# Patient Record
Sex: Male | Born: 1975
Health system: Southern US, Community
[De-identification: ages and names within clinical notes are randomized; demographics above are authoritative.]

## PROBLEM LIST (undated history)

## (undated) DIAGNOSIS — K259 Gastric ulcer, unspecified as acute or chronic, without hemorrhage or perforation: Secondary | ICD-10-CM

## (undated) DIAGNOSIS — I10 Essential (primary) hypertension: Secondary | ICD-10-CM

## (undated) DIAGNOSIS — E7800 Pure hypercholesterolemia, unspecified: Secondary | ICD-10-CM

## (undated) HISTORY — DX: Essential (primary) hypertension: I10

## (undated) HISTORY — PX: TYMPANOSTOMY TUBE PLACEMENT: SHX32

## (undated) HISTORY — DX: Pure hypercholesterolemia, unspecified: E78.00

---

## 1999-10-09 ENCOUNTER — Emergency Department (HOSPITAL_COMMUNITY): Admission: EM | Admit: 1999-10-09 | Discharge: 1999-10-09 | Payer: Self-pay | Admitting: Emergency Medicine

## 1999-10-28 ENCOUNTER — Observation Stay (HOSPITAL_COMMUNITY): Admission: EM | Admit: 1999-10-28 | Discharge: 1999-10-28 | Payer: Self-pay | Admitting: Emergency Medicine

## 1999-10-28 ENCOUNTER — Encounter: Payer: Self-pay | Admitting: Emergency Medicine

## 1999-10-28 HISTORY — PX: OTHER SURGICAL HISTORY: SHX169

## 2007-02-23 ENCOUNTER — Emergency Department (HOSPITAL_COMMUNITY): Admission: EM | Admit: 2007-02-23 | Discharge: 2007-02-23 | Payer: Self-pay | Admitting: Emergency Medicine

## 2007-03-09 ENCOUNTER — Emergency Department (HOSPITAL_COMMUNITY): Admission: EM | Admit: 2007-03-09 | Discharge: 2007-03-09 | Payer: Self-pay | Admitting: Emergency Medicine

## 2007-08-12 ENCOUNTER — Emergency Department (HOSPITAL_COMMUNITY): Admission: EM | Admit: 2007-08-12 | Discharge: 2007-08-12 | Payer: Self-pay | Admitting: Emergency Medicine

## 2008-07-16 ENCOUNTER — Emergency Department (HOSPITAL_COMMUNITY): Admission: EM | Admit: 2008-07-16 | Discharge: 2008-07-16 | Payer: Self-pay | Admitting: Family Medicine

## 2008-10-21 ENCOUNTER — Emergency Department (HOSPITAL_COMMUNITY): Admission: EM | Admit: 2008-10-21 | Discharge: 2008-10-21 | Payer: Self-pay | Admitting: Emergency Medicine

## 2010-05-23 ENCOUNTER — Ambulatory Visit: Payer: Self-pay | Admitting: Internal Medicine

## 2010-05-23 LAB — CONVERTED CEMR LAB
ALT: 12 units/L (ref 0–53)
CO2: 27 meq/L (ref 19–32)
Calcium: 9.8 mg/dL (ref 8.4–10.5)
Chloride: 100 meq/L (ref 96–112)
Cholesterol: 161 mg/dL (ref 0–200)
Creatinine, Ser: 1.06 mg/dL (ref 0.40–1.50)
Glucose, Bld: 97 mg/dL (ref 70–99)
Sodium: 138 meq/L (ref 135–145)
Total Protein: 7.8 g/dL (ref 6.0–8.3)
Triglycerides: 59 mg/dL (ref <150)

## 2010-10-03 ENCOUNTER — Emergency Department (HOSPITAL_COMMUNITY)
Admission: EM | Admit: 2010-10-03 | Discharge: 2010-10-03 | Payer: Self-pay | Source: Home / Self Care | Admitting: Emergency Medicine

## 2011-01-26 NOTE — Op Note (Signed)
[place]  Patient:    Andre Jones, Andre Jones                        MRN: [id number] Proc. Date: 10/28/99 Adm. Date:  [id number] Disc. Date: [id number] Attending:  Rebeca Alert                           Operative Report  PREOPERATIVE DIAGNOSIS:  Fracture of right parasymphysis of mandible and fracture of left angle of mandible.  POSTOPERATIVE DIAGNOSIS:  Fracture of right parasymphysis of mandible and fracture of left angle of mandible.  OPERATION PERFORMED: 1. Surgical closed reduction of fractured right parasymphysis of mandible. 2. Closed reduction of left angle of mandible with application of Erich arch bars    and intermaxillaxy fixation.  SURGEON:  Hinton Dyer, D.D.S.  ESTIMATED BLOOD LOSS:  Less than 50 cc.  CONDITION AT END OF SURGERY:  Good  ANESTHESIA:  General.  DESCRIPTION OF PROCEDURE:  Following preoperative medication, the patient was brought to the operating room in the supine position in which he remained throughout the whole procedure.  He was intubated by a right nasal endotracheal  tube and prepped and draped in the usual fashion for an intraoral procedure. The mouth was prepared with Betadine scrub and paint both intraorally and extraorally placing a moist open 4 x 4 gauze around the endotracheal tube.  With a bite block in place, the mouth was suctioned out and an Erich arch bar was fitted by taking a 24 gauge stainless steel wire on two curved hemostats extending it from tooth #3 to tooth #15. Tooth #14 was fractured off and not visible.  A 24 gauge stainless steel wire was passed around teeth numbers 3, 4, 5, 6, 7, 8, 9, 10, 11, 12, 13, and 15 and around the [place] arch bar.  Each wire was then rosetted down and turned in  clockwise direction, turned into the mucobuccal fold.  Attention was then turned to the mandible and the arch was measured from tooth #18 to tooth #30 and this was  done by a 24  gauge stainless steel wire on two hemostats.  Again the [facility] arch bar was cut to fit the arch and then the 24 gauge stainless steel wire was passed around teeth numbers 18, 19, 20, 21, 22, 23, 25, 26, 27, 28, 29 and 30.  A 24 was left off as there was concern that the fracture coming up between that tooth and then one next to it might pull that tooth out of the arch.  The fractures were hen lined up prior to tightening the wires and once the wires were tightened, the fracture lined up nicely.  The inferior borders were checked by palpation and once it was felt that the arch bar was stable and that the fractures were lined up properly, the throat pack was removed. A [place] sump was placed and the stomach as checked for any contents.  Then the mouth was placed into what appeared to be the appropriate occlusion and five intermaxillary fixation wires were passed around the [place] arch bars and then turned in a clockwise fashion, folding everything down to avoid interference with the lips.  He was extubated on the table and returned to the recovery room in good condition.  He is being discharged home with a prescription for Keflex elixir x 300 cc, 250 mg per 5 cc and Lortab  Elixir x 300 cc 1 or 2 q.4h. p.r.n. pain.  He will be followed by me in my private office and we will get a postoperative x-ray in the office as well.DD:  10/28/99 TD:  10/29/99 Job: [id number]

## 2011-06-18 LAB — URINALYSIS, ROUTINE W REFLEX MICROSCOPIC
Glucose, UA: NEGATIVE
Hgb urine dipstick: NEGATIVE
Specific Gravity, Urine: 1.026

## 2011-06-18 LAB — LIPASE, BLOOD: Lipase: 40

## 2011-07-18 ENCOUNTER — Ambulatory Visit (INDEPENDENT_AMBULATORY_CARE_PROVIDER_SITE_OTHER): Payer: Self-pay | Admitting: Physician Assistant

## 2011-07-18 DIAGNOSIS — R079 Chest pain, unspecified: Secondary | ICD-10-CM

## 2011-07-18 DIAGNOSIS — R9431 Abnormal electrocardiogram [ECG] [EKG]: Secondary | ICD-10-CM

## 2011-07-18 NOTE — Progress Notes (Signed)
Andre Jones is a 35 y.o. male cigarette smoker with a h/o HTN, HLP, prior cocaine use who is referred for ETT for chest pain and abnormal EKG.  He also notes several syncopal episodes.  No FHx of SCD.  No significant FHx of CAD.  Exam unremarkable.    Exercise Treadmill Test  Pre-Exercise Testing Evaluation Rhythm: normal sinus  Rate: 64   PR:  .13 QRS:  .09  QT:  .38 QTc: .40     Test  Exercise Tolerance Test Ordering MD: Dr. Georganna Skeans  Interpreting MD:  Tereso Newcomer PA-C  Unique Test No: 1  Treadmill:  1  Indication for ETT: Abnormal EKG  Contraindication to ETT: No   Stress Modality: exercise - treadmill  Cardiac Imaging Performed: non   Protocol: standard Bruce - maximal  Max BP: 164/106  Max MPHR (bpm):  185 85% MPR (bpm):  157  MPHR obtained (bpm): 164 % MPHR obtained:  91  Reached 85% MPHR (min:sec):  12:30 Total Exercise Time (min-sec):  12:50  Workload in METS:  14.9 Borg Scale: 15  Reason ETT Terminated:  patient's desire to stop    ST Segment Analysis At Rest: normal ST segments - no evidence of significant ST depression With Exercise: no evidence of significant ST depression  Other Information Arrhythmia:  Occasional PVCs; one ventricular couplet Angina during ETT:  absent (0) Quality of ETT:  diagnostic  ETT Interpretation:  normal - no evidence of ischemia by ST analysis  Comments: Good exercise tolerance. No chest pain. Normal BP response to exercise. No ST-T changes to suggest ischemia. Frequent PVCs noted prior to exercise and during.     Recommendations: Follow up with Dr. Andrey Campanile as directed. Question if he has symptomatic PVCs.   Consider proceeding with an Echocardiogram and Event Monitor in light of his h/o syncope.  Defer decision to his PCP. Consider beta blocker therapy or non-dihydropyridine calcium channel blocker.  Defer decision to his PCP. Tereso Newcomer, PA-C

## 2012-08-22 ENCOUNTER — Ambulatory Visit: Payer: Self-pay | Admitting: Family Medicine

## 2012-09-17 ENCOUNTER — Encounter: Payer: Self-pay | Admitting: Family Medicine

## 2012-09-17 ENCOUNTER — Ambulatory Visit (INDEPENDENT_AMBULATORY_CARE_PROVIDER_SITE_OTHER): Payer: Self-pay | Admitting: Family Medicine

## 2012-09-17 VITALS — BP 147/91 | HR 48 | Temp 98.2°F | Ht 77.0 in | Wt 184.0 lb

## 2012-09-17 DIAGNOSIS — F17200 Nicotine dependence, unspecified, uncomplicated: Secondary | ICD-10-CM

## 2012-09-17 DIAGNOSIS — E7800 Pure hypercholesterolemia, unspecified: Secondary | ICD-10-CM

## 2012-09-17 DIAGNOSIS — I10 Essential (primary) hypertension: Secondary | ICD-10-CM | POA: Insufficient documentation

## 2012-09-17 DIAGNOSIS — Z720 Tobacco use: Secondary | ICD-10-CM

## 2012-09-17 MED ORDER — PRAVASTATIN SODIUM 20 MG PO TABS: 20.0000 mg | ORAL_TABLET | Freq: Every day | ORAL | Status: DC

## 2012-09-17 MED ORDER — AMLODIPINE BESYLATE 5 MG PO TABS: 5.0000 mg | ORAL_TABLET | Freq: Every day | ORAL | Status: DC

## 2012-09-17 MED ORDER — HYDROCHLOROTHIAZIDE 25 MG PO TABS: 25.0000 mg | ORAL_TABLET | Freq: Every day | ORAL | Status: DC

## 2012-09-17 NOTE — Progress Notes (Addendum)
Subjective:  Patient presents today to establish care. Chief complaint-noted.   1. Hypertension- BP Readings from Last 3 Encounters:  09/17/12 147/91   Home BP monitoring-no Patient focuses on having a low salt diet.  Compliant with medications-yes without side effects but did run out of amlodipine x 1 month due to transfer from [facility].  Denies any CP, HA,  blurry vision, LE edema, transient weakness, orthopnea, PND. Does have some SOB-see below. Feels an occasional palpitations but states he has had a normal stress test on treadmill. Also with occasional SOB but at rest and resolves within seconds with deep breaths.   2. Hyperlipidemia-compliant with pravastatin. Unsure of last LDL. No muscle aches/fatigue.   3. Tobacco abuse-10 pack years. Tried quitting a year ago using 1-800-quit now. Recently lost his job on new years day. Not ready to quit due to stressor of not being able to find work.   [facility] records: BP typically well controlled with exception of elevation in March 2013.  Amlodipine was decreased to 5mg  due to hypotension 07/09/11-Patient with syncopal event and had happened 3x. No hx seizures. "out for few seconds with funny feelings in chest and ears"  Exercise stress test 07/09/11-reported normal. Results from EKG to be scanned in but not clear if this was from stress test. Does have elevated t-wave (no history hyperkalemia in records). No j-point elevation noted but st segment slopes upward quickly in V2 through v4.  History of Trichomonas s/p treatment.    The following were reviewed and entered/updated in epic: Past Medical History  Diagnosis Date  . HTN (hypertension)   . Hypercholesteremia    Past Surgical History  Procedure Date  . Surgical closed reduction of fractured right parasymphysis of mandible 10/28/99  . Tympanostomy tube placement     as a child. Feels ear fullness sometimes. Has had normal audiological exam.    Medications- reviewed and  updated Reviewed problem list.  Allergies-reviewed and updated History   Social History  . Marital Status: Single    Spouse Name: N/A    Number of Children: N/A  . Years of Education: N/A   Social History Main Topics  . Smoking status: Current Every Day Smoker -- 0.5 packs/day for 10 years  . Smokeless tobacco: Not on file  . Alcohol Use: Yes     Comment: occasional-4 40 oz per week.   . Drug Use: Yes     Comment: marijuna  . Sexually Active: Yes -- Male partner(s)     Comment: Partner with depo provera. Wants testing again.    Other Topics Concern  . Not on file   Social History Narrative   Outgoing. Active. Lived [place] all of his life. Father of 2 children 7, 15 girls. Child support for oldest and gets to see her. Youngest doesn't get to see but is in touch-adopted. Fraternal twin brother very close to him. About to be best man in his wedding. Gets a ride or uses bus. [employer] through 10th grade. Working on Neurosurgeon. Recently lost job [employer] 2014. Worked for a year. Struggling for work because of criminal background.     ROS--See HPI   Objective:  BP 147/91  Pulse 48  Temp 98.2 F (36.8 C) (Oral)  Ht 6\' 5"  (1.956 m)  Wt 184 lb (83.462 kg)  BMI 21.82 kg/m2  General Appearance:    Pleasant, Alert, cooperative, no distress, appears stated age  Head:    Normocephalic, without obvious abnormality, atraumatic  Eyes:  PERRL, conjunctiva/corneas clear, EOM's intact       Ears:    Normal TM's. External ear canal on left with some excoriated areas (encouraged no q-tips)   Nose:  mucosa normal, slight drainage, some erythema  Throat:   Lips, mucosa, and tongue normal; poor dentition noted (patient cannot afford dentist currently)   Back:     Symmetric,  ROM normal, no CVA tenderness  Lungs:     Clear to auscultation bilaterally, respirations unlabored  Heart:    Regular rate and rhythm, S1 and S2 normal, no murmur, rub   or gallop  Abdomen:     Soft, non-tender, bowel  sounds active all four quadrants,    no masses, no organomegaly. Some diaphoresis (room extremely warm)   Extremities:   No edema.   Neurologic:   Grossly normal. Normal strength, sensation and reflexes      Throughout. Normal gait.      Assessment/Plan: See problem oriented charted  No record of TDAP or flu shot. Patient deferred both until he has orange card.

## 2012-09-17 NOTE — Assessment & Plan Note (Signed)
Not ready to quit. Advised cessation. Will follow.

## 2012-09-17 NOTE — Assessment & Plan Note (Signed)
Restarted pravastatin. Will view medical records to determine LDL. GIven HTN and smoking, would aim for LDL <130.

## 2012-09-17 NOTE — Assessment & Plan Note (Addendum)
Poorly controlled on HCTZ only. Will restart amlodipine. Patient for visit as soon as he gets the orange card and will obtain some baseline labs (CMET, CBC, lipid panel)  Bradycardia also noted in thin athletic male on vitals. This was not consistent with my examination of the heart with HR approximately 60. Will plan on EKG at next visit. From [facility] records, typically HR near 60.

## 2012-09-17 NOTE — Patient Instructions (Signed)
Dear Andre Jones,   It was meet to see you today. Thank you for coming to clinic. Please read below regarding the issues that we discussed.   1. For your blood pressure, continue the low salt diet and the medications (amlodipine and HCTZ) 2. For your cholesterol, continue your pravastatin.  3. When you become ready to quit smoking, you let me know.   Please follow up in clinic within 6 months for lab work and to make sure your blood pressure is better on the medicine. Please call earlier if you have any questions or concerns.   Sincerely,  Dr. Tana Conch   My 5 to Fitness! These are tips I give to every patient that  are important for living a healthy life!   5: fruits and vegetables per day (work on 9 per day if you are at 5) 4: exercise 4-5 times per week for at least 30 minutes (walking counts!) 3: meals per day (don't skip breakfast!) 2: habits to quit  -smoking  -excess alcohol use (men >2 beer/day; women >1beer/day) 1: sweet per day (2 cookies, 1 small cup of ice cream, 12 oz soda)  These are general tips for healthy living. Try to start with 1 or 2 habit TODAY and make it a part of your life for several months. Once you have 1 or 2 habits down for several months, try to begin working on your next healthy habit. With every single step you take, you will be leading a healthier lifestyle!   Health Maintenance Due  Topic Date Due  . Tetanus/tdap  03/17/1995  . Influenza Vaccine  05/11/2012

## 2012-10-03 ENCOUNTER — Ambulatory Visit: Payer: Self-pay | Admitting: Family Medicine

## 2012-10-09 ENCOUNTER — Ambulatory Visit: Payer: Self-pay | Admitting: Family Medicine

## 2012-10-13 ENCOUNTER — Encounter: Payer: Self-pay | Admitting: Family Medicine

## 2012-10-13 ENCOUNTER — Ambulatory Visit (HOSPITAL_COMMUNITY)
Admission: RE | Admit: 2012-10-13 | Discharge: 2012-10-13 | Disposition: A | Discharge status: Home / Self Care | Payer: Self-pay | Source: Ambulatory Visit | Attending: Family Medicine | Admitting: Family Medicine

## 2012-10-13 ENCOUNTER — Ambulatory Visit (INDEPENDENT_AMBULATORY_CARE_PROVIDER_SITE_OTHER): Payer: Self-pay | Admitting: Family Medicine

## 2012-10-13 VITALS — BP 121/70 | HR 71 | Temp 98.6°F | Ht 77.0 in | Wt 183.0 lb

## 2012-10-13 DIAGNOSIS — R0781 Pleurodynia: Secondary | ICD-10-CM

## 2012-10-13 DIAGNOSIS — R079 Chest pain, unspecified: Secondary | ICD-10-CM

## 2012-10-13 NOTE — Patient Instructions (Addendum)
Go to the hospital for a chest xray.  They will send me the results and I will call you.    We'll talk about pain management once I get the results.  Take Tylenol or Advil in the meantime.

## 2012-10-13 NOTE — Progress Notes (Signed)
  Subjective:    Patient ID: Andre Jones, male    DOB: Mar 31, 1976, 37 y.o.   MRN: [id number]  HPI  1.  Right sided rib pain:  Present about 1 month.  Describes as feeling like it is bruised, sharp stabbing pain.  Has noted "knot" there as well.  Present as PCP visit earlier this month but did not bring it up at that point.  No fevers or chills, no weight loss/night sweats.  Did have trauma to the area resulting in "scratch" on skin.  Deflects questions about what might have happened, stating he doesn't remember.   Has not had to take anything for pain relief.   Review of Systems See HPI above for review of systems.       Objective:   Physical Exam Gen:  Alert, cooperative patient who appears stated age in no acute distress.  Vital signs reviewed. Chest:  Nodule noted distal end of Rib 10.  TTP at this point.  No palpable step-off. Abd:  Soft/nondistended/nontender.  Good bowel sounds throughout all four quadrants.  No masses noted.  Murphy's negative SKin:  Healing abrasion noted about 3 cm superior to nodule on rib       Assessment & Plan:

## 2012-10-13 NOTE — Assessment & Plan Note (Signed)
Likely bruised from whatever injury he sustained that he doesn't want to talk about. Sending for x-ray. OTC anaglesics for relief currently, will call and discuss pain management after results of x-ray return.

## 2012-10-15 ENCOUNTER — Telehealth: Payer: Self-pay | Admitting: Family Medicine

## 2012-10-15 NOTE — Telephone Encounter (Signed)
Pt is asking for results of his xray - pls advise

## 2012-10-16 ENCOUNTER — Telehealth: Payer: Self-pay | Admitting: Family Medicine

## 2012-10-16 MED ORDER — TRAMADOL HCL 50 MG PO TABS: 50.0000 mg | ORAL_TABLET | Freq: Three times a day (TID) | ORAL | Status: DC | PRN

## 2012-10-16 NOTE — Telephone Encounter (Signed)
Called and discussed that x-rays did not show any fracture of ribs.  Patient states pain is improving.  He is still concerned about the "knot" that is located there.  I discussed that x-rays did not show any evidence of abnormal growth on any of his ribs.  He is to call back next week if he is still having trouble and we can decide about further imaging at that time if necessary.  LFTs WNL most recent lab check.  No weight loss/night sweats/fevers or chills, so this makes malignancy much less likely.  Will send in Tramadol for pain relief.  Discussed that still believe this is bone bruise of his ribs secondary to trauma, especially with overlying scratch to area and fact patient reports "not remembering" what caused initial abrasion and contusion/pain.  Will FU with [place] next week.

## 2012-10-22 ENCOUNTER — Ambulatory Visit: Payer: Self-pay | Admitting: Family Medicine

## 2013-05-04 ENCOUNTER — Ambulatory Visit: Payer: Self-pay

## 2013-05-13 ENCOUNTER — Ambulatory Visit: Payer: Self-pay | Admitting: Family Medicine

## 2013-05-27 ENCOUNTER — Ambulatory Visit: Payer: Self-pay | Admitting: Family Medicine

## 2013-06-14 ENCOUNTER — Emergency Department (HOSPITAL_COMMUNITY)
Admission: EM | Admit: 2013-06-14 | Discharge: 2013-06-14 | Disposition: A | Discharge status: Home / Self Care | Payer: No Typology Code available for payment source | Attending: Emergency Medicine | Admitting: Emergency Medicine

## 2013-06-14 DIAGNOSIS — R1012 Left upper quadrant pain: Secondary | ICD-10-CM

## 2013-06-14 DIAGNOSIS — Z760 Encounter for issue of repeat prescription: Secondary | ICD-10-CM | POA: Insufficient documentation

## 2013-06-14 DIAGNOSIS — Z79899 Other long term (current) drug therapy: Secondary | ICD-10-CM | POA: Insufficient documentation

## 2013-06-14 DIAGNOSIS — I10 Essential (primary) hypertension: Secondary | ICD-10-CM | POA: Insufficient documentation

## 2013-06-14 DIAGNOSIS — Z7982 Long term (current) use of aspirin: Secondary | ICD-10-CM | POA: Insufficient documentation

## 2013-06-14 DIAGNOSIS — F17200 Nicotine dependence, unspecified, uncomplicated: Secondary | ICD-10-CM | POA: Insufficient documentation

## 2013-06-14 DIAGNOSIS — E7800 Pure hypercholesterolemia, unspecified: Secondary | ICD-10-CM | POA: Insufficient documentation

## 2013-06-14 LAB — COMPREHENSIVE METABOLIC PANEL
ALT: 15 U/L (ref 0–53)
AST: 23 U/L (ref 0–37)
Albumin: 4.3 g/dL (ref 3.5–5.2)
Alkaline Phosphatase: 54 U/L (ref 39–117)
BUN: 10 mg/dL (ref 6–23)
CO2: 26 mEq/L (ref 19–32)
Calcium: 9.3 mg/dL (ref 8.4–10.5)
Chloride: 102 mEq/L (ref 96–112)
Creatinine, Ser: 1.04 mg/dL (ref 0.50–1.35)
GFR calc Af Amer: >90 mL/min (ref >90)
GFR calc non Af Amer: >90 mL/min (ref >90)
Glucose, Bld: 89 mg/dL (ref 70–99)
Potassium: 4.3 mEq/L (ref 3.5–5.1)
Sodium: 141 mEq/L (ref 135–145)
Total Bilirubin: 0.3 mg/dL (ref 0.3–1.2)
Total Protein: 7.7 g/dL (ref 6.0–8.3)

## 2013-06-14 LAB — URINALYSIS, ROUTINE W REFLEX MICROSCOPIC
Bilirubin Urine: NEGATIVE
Glucose, UA: NEGATIVE mg/dL
Hgb urine dipstick: NEGATIVE
Ketones, ur: NEGATIVE mg/dL
Leukocytes, UA: NEGATIVE
Nitrite: NEGATIVE
Protein, ur: NEGATIVE mg/dL
Specific Gravity, Urine: 1.026 (ref 1.005–1.030)
Urobilinogen, UA: 0.2 mg/dL (ref 0.0–1.0)
pH: 5.5 (ref 5.0–8.0)

## 2013-06-14 LAB — CBC WITH DIFFERENTIAL/PLATELET
Basophils Absolute: 0 10*3/uL (ref 0.0–0.1)
Basophils Relative: 0 % (ref 0–1)
Eosinophils Absolute: 0.3 10*3/uL (ref 0.0–0.7)
Eosinophils Relative: 3 % (ref 0–5)
HCT: 44.7 % (ref 39.0–52.0)
Hemoglobin: 16.3 g/dL (ref 13.0–17.0)
Lymphocytes Relative: 24 % (ref 12–46)
Lymphs Abs: 2.1 10*3/uL (ref 0.7–4.0)
MCH: 33.3 pg (ref 26.0–34.0)
MCHC: 36.5 g/dL — ABNORMAL HIGH (ref 30.0–36.0)
MCV: 91.2 fL (ref 78.0–100.0)
Monocytes Absolute: 0.7 10*3/uL (ref 0.1–1.0)
Monocytes Relative: 8 % (ref 3–12)
Neutro Abs: 5.6 10*3/uL (ref 1.7–7.7)
Neutrophils Relative %: 65 % (ref 43–77)
Platelets: 249 10*3/uL (ref 150–400)
RBC: 4.9 MIL/uL (ref 4.22–5.81)
RDW: 12.4 % (ref 11.5–15.5)
WBC: 8.6 10*3/uL (ref 4.0–10.5)

## 2013-06-14 LAB — LIPASE, BLOOD: Lipase: 38 U/L (ref 11–59)

## 2013-06-14 MED ORDER — HYDROCODONE-ACETAMINOPHEN 5-325 MG PO TABS: ORAL_TABLET | ORAL | Status: DC

## 2013-06-14 MED ORDER — HYDROCODONE-ACETAMINOPHEN 5-325 MG PO TABS
1.0000 | ORAL_TABLET | Freq: Once | ORAL | Status: AC
  Administered 2013-06-14: 1 via ORAL
  Filled 2013-06-14: qty 1

## 2013-06-14 NOTE — ED Provider Notes (Signed)
CSN: [id number]     Arrival date & time 06/14/13  1506 History   First MD Initiated Contact with Patient 06/14/13 1620     Chief Complaint  Patient presents with  . Abdominal Pain  . Medication Refill   (Consider location/radiation/quality/duration/timing/severity/associated sxs/prior Treatment) HPI   Dyrell Skalski is a 37 y.o. male with PMH of HTN and HLD c/o LUQ pain rated at 7/10 x7 days. Pt reports that sometimes the pain increases sharply but cannot identify exacerbating factors. Denies fever, N/V, change in bowel or bladder habits, melena, hematochezia, sick contacts, cough, SOB. Pt also reports that he has been out of his HTN meds because he had not had time to pick them up. Denies CP, SOB, HA, dysarthria, ataxia, focal weakness.   Past Medical History  Diagnosis Date  . HTN (hypertension)   . Hypercholesteremia    Past Surgical History  Procedure Laterality Date  . Surgical closed reduction of fractured right parasymphysis of mandible  10/28/99  . Tympanostomy tube placement      as a child. Feels ear fullness sometimes. Has had normal audiological exam.    Family History  Problem Relation Age of Onset  . Hypertension Brother     sister, mother, father  . Diabetes Mellitus II Mother    History  Substance Use Topics  . Smoking status: Current Every Day Smoker -- 0.50 packs/day for 10 years  . Smokeless tobacco: Not on file  . Alcohol Use: Yes     Comment: occasional-4 40 oz per week.     Review of Systems 10 systems reviewed and found to be negative, except as noted in the HPI  Allergies  Shellfish allergy  Home Medications   Current Outpatient Rx  Name  Route  Sig  Dispense  Refill  . amLODipine (NORVASC) 5 MG tablet   Oral   Take 1 tablet (5 mg total) by mouth daily.   30 tablet   11   . aspirin 81 MG tablet   Oral   Take 81 mg by mouth daily.         . hydrochlorothiazide (HYDRODIURIL) 25 MG tablet   Oral   Take 1 tablet (25 mg total) by mouth  daily.   30 tablet   11   . pravastatin (PRAVACHOL) 20 MG tablet   Oral   Take 1 tablet (20 mg total) by mouth daily.   30 tablet   11    BP 161/98  Pulse 55  Temp(Src) 98.3 F (36.8 C) (Oral)  Resp 16  Wt 174 lb 11.2 oz (79.243 kg)  BMI 20.71 kg/m2  SpO2 99% Physical Exam  Nursing note and vitals reviewed. Constitutional: He is oriented to person, place, and time. He appears well-developed and well-nourished. No distress.  HENT:  Head: Normocephalic.  Mouth/Throat: Oropharynx is clear and moist.  Eyes: Conjunctivae and EOM are normal. Pupils are equal, round, and reactive to light.  Neck: Normal range of motion. Neck supple.  Cardiovascular: Normal rate, regular rhythm and intact distal pulses.   Pulmonary/Chest: Effort normal and breath sounds normal. No stridor.  Abdominal: Soft. Bowel sounds are normal. He exhibits no distension and no mass. There is no tenderness. There is no rebound and no guarding.  Musculoskeletal: Normal range of motion. He exhibits no edema.  Neurological: He is alert and oriented to person, place, and time.  Cranial nerves III through XII intact, strength 5 out of 5x4 extremities, negative pronator drift, finger to nose and heel-to-shin coordinated,  sensation intact to pinprick and light touch, gait is coordinated and Romberg is negative.    Psychiatric: He has a normal mood and affect.    ED Course  Procedures (including critical care time) Labs Review Labs Reviewed  URINALYSIS, ROUTINE W REFLEX MICROSCOPIC - Abnormal; Notable for the following:    APPearance HAZY (*)    All other components within normal limits  CBC WITH DIFFERENTIAL - Abnormal; Notable for the following:    MCHC 36.5 (*)    All other components within normal limits  COMPREHENSIVE METABOLIC PANEL  LIPASE, BLOOD   Imaging Review No results found.  MDM   1. LUQ abdominal pain      Filed Vitals:   06/14/13 1518 06/14/13 1630 06/14/13 1645  BP: 161/98 154/87  150/90  Pulse: 55 75 58  Temp: 98.3 F (36.8 C)    TempSrc: Oral    Resp: 16    Weight: 174 lb 11.2 oz (79.243 kg)    SpO2: 99% 96% 100%     Ly Kimberley is a 37 y.o. male non-compliant with HTN Rx, does not need script. C/o LUQ pain x7 days with no associated symptoms. ABD exam is benign with no TTP. Blood work unremarkable and Pt refused FOBT. Will control pain and advised him to follow with PCP and pick up his HTN Rx.    Medications  HYDROcodone-acetaminophen (NORCO/VICODIN) 5-325 MG per tablet 1 tablet (not administered)    Pt is hemodynamically stable, appropriate for, and amenable to discharge at this time. Pt verbalized understanding and agrees with care plan. All questions answered. Outpatient follow-up and specific return precautions discussed.    New Prescriptions   HYDROCODONE-ACETAMINOPHEN (NORCO/VICODIN) 5-325 MG PER TABLET    Take 1-2 tablets by mouth every 6 hours as needed for pain.    Note: Portions of this report may have been transcribed using voice recognition software. Every effort was made to ensure accuracy; however, inadvertent computerized transcription errors may be present      Wynetta Emery, PA-C 06/14/13 1702

## 2013-06-14 NOTE — ED Notes (Addendum)
Pt with "stabbing" LUQ abd pain x 1 week. He did vomit one day this week after trying to drink milk and take pepto bismol for pain. Denies bowel/bladder changes. Also states has been out of his blood pressure meds x 1 week and has a refill but has been unable to pick up medicine because of his work schedule

## 2013-06-14 NOTE — ED Notes (Addendum)
Reviewed discharge and f/u instructions with patient, discussed blood/urine results as well. Pt concerned with possible stomach ulcer, EDPA aware and spoke with pt regarding lab results as well. Pt comfortable with d/c at this time. Prescriptions x1. Pt states arrived via bus and will be taking bus home.

## 2013-06-22 NOTE — ED Provider Notes (Signed)
Medical screening examination/treatment/procedure(s) were performed by non-physician practitioner and as supervising physician I was immediately available for consultation/collaboration.  Raeford Razor, MD 06/22/13 [phone]

## 2013-06-25 ENCOUNTER — Ambulatory Visit (INDEPENDENT_AMBULATORY_CARE_PROVIDER_SITE_OTHER): Payer: No Typology Code available for payment source | Admitting: Family Medicine

## 2013-06-25 ENCOUNTER — Encounter: Payer: Self-pay | Admitting: Family Medicine

## 2013-06-25 DIAGNOSIS — K259 Gastric ulcer, unspecified as acute or chronic, without hemorrhage or perforation: Secondary | ICD-10-CM

## 2013-06-25 DIAGNOSIS — R1013 Epigastric pain: Secondary | ICD-10-CM

## 2013-06-25 DIAGNOSIS — A048 Other specified bacterial intestinal infections: Secondary | ICD-10-CM

## 2013-06-25 DIAGNOSIS — K219 Gastro-esophageal reflux disease without esophagitis: Secondary | ICD-10-CM

## 2013-06-25 DIAGNOSIS — I10 Essential (primary) hypertension: Secondary | ICD-10-CM

## 2013-06-25 MED ORDER — HYDROCHLOROTHIAZIDE 25 MG PO TABS: 25.0000 mg | ORAL_TABLET | Freq: Every day | ORAL | Status: DC

## 2013-06-25 MED ORDER — OMEPRAZOLE 20 MG PO CPDR: 40.0000 mg | DELAYED_RELEASE_CAPSULE | Freq: Every day | ORAL | Status: DC

## 2013-06-25 MED ORDER — CLARITHROMYCIN 500 MG PO TABS: 500.0000 mg | ORAL_TABLET | Freq: Two times a day (BID) | ORAL | Status: DC

## 2013-06-25 MED ORDER — AMOXICILLIN 500 MG PO CAPS: 1000.0000 mg | ORAL_CAPSULE | Freq: Two times a day (BID) | ORAL | Status: DC

## 2013-06-25 MED ORDER — OMEPRAZOLE 20 MG PO CPDR: 20.0000 mg | DELAYED_RELEASE_CAPSULE | Freq: Two times a day (BID) | ORAL | Status: DC

## 2013-06-25 MED ORDER — AMLODIPINE BESYLATE 5 MG PO TABS: 5.0000 mg | ORAL_TABLET | Freq: Every day | ORAL | Status: DC

## 2013-06-25 NOTE — Patient Instructions (Addendum)
I am concerned you may have an ulcer caused by H. Pylori. You need to take the omeprazole, amoxicillin, and clarithromycin for 2 weeks to heal this.   See me in 2-3 weeks to follow up on your chronic healthcare needs, Dr. Durene Cal  Health Maintenance Due  Topic Date Due  . Tetanus/tdap  03/17/1995  . Influenza Vaccine  04/10/2013   Helicobacter Pylori and Ulcer Disease An ulcer may be in your stomach (gastric ulcer) or in the first part of your small bowel, which is called the duodenum (duodenal ulcer). An ulcer is a break in the stomach or duodenum lining. The break wears down into the deeper tissue. Helicobacter pylori (H. pylori) is a type of germ (bacteria) that may cause the majority of gastric or duodenal ulcers. CAUSES   A germ (bacterium). H. pylori can weaken the protective mucous coating of the stomach and duodenum. This allows acid to get through to the sensitive lining of the stomach or duodenum and an ulcer can then form.  Certain medications.  Using substances that can bother the lining of the stomach (alcohol, tobacco or medications such as Advil or Motrin) in the presence of H.pylori infection. This can increase the chances of getting an ulcer.  Cancer (rarely). Most people infected with H. pylori do not get ulcers. It is not known how people catch H. pylori. It may be through food or water. H. pylori has been found in the saliva of some infected people. Therefore, the bacteria may also spread through mouth-to-mouth contact such as kissing. SYMPTOMS  The problems (symptoms) of ulcer disease are usually:  A burning or gnawing of the mid-upper belly (abdomen). This is often worse on an empty stomach. It may get better with food. This may be associated with feeling sick to your stomach (nausea), bloating and vomiting.  If the ulcer results in bleeding, it can cause:  Black, tarry stools.  Throwing up bright red blood.  Throwing up coffee ground looking materials. With  severe bleeding, there may be loss of consciousness and shock. Besides ulcer disease, H. pylori can also cause chronic gastritis (irritation of the lining of the stomach without ulcer) or stomach acid-type discomfort. You may not have symptoms even though you have an H. pylori infection. Although this is an infection, you may not have usual infection symptoms (such as fever). DIAGNOSIS  Ulcer disease can be diagnosed in many different ways. If you have an ulcer, it is important to know whether or not it is caused by H. Pylori. Treatment for an ulcer caused by H. pylori is different from that for an ulcer with other causes. The best way to detect H. pylori is taking tissue directly from the ulcer during an endoscopy test.   An endoscopy is an exam that uses an endoscope. This is a thin, lighted tube with a small camera on the end. It is like a flexible telescope. The patient is given a drug to make them calm (sedative). The caregiver eases the endoscope into the mouth and down the throat to the stomach and duodenum. This allows the doctor to see the lining of the esophagus, stomach and duodenum.  If an endoscopy is not needed, then H. pylori can be detected with tests of the blood, stool or even breath. TREATMENT   H. pylori peptic ulcer treatment usually involves a combination of:  Medicines that kill germs (antibiotics).  Acid suppressors.  Stomach protectors.  The use of only one medication to treat H. pylori is  not recommended. The most proven treatment is a 2 week course of treatment called triple therapy. It involves taking two antibiotics to kill the bacteria and either an acid suppressor or stomach-lining shield. Two-week triple therapy reduces ulcer symptoms, kills the bacteria, and prevents ulcers from coming back in many patients.  Unfortunately, patients may find triple therapy hard to do. This is because it involves taking as many as 20 pills a day. Also, the antibiotics used in  triple therapy may cause mild side effects. These include nausea, vomiting, diarrhea, dark stools, a metallic taste in the mouth, dizziness, headache and yeast infections in women. Talk to your caregiver if you have any of these side effects. HOME CARE INSTRUCTIONS   Take your medications as directed and for as long as prescribed. Contact your caregiver if you have problems or side effects from your medications.  Continue regular work and usual activities unless told otherwise by your caregiver.  Avoid tobacco, alcohol and caffeine. Tobacco use will decrease and slow healing.  Avoid medications that are harmful. This includes aspirin and NSAIDS such as ibuprofen and naproxen.  Avoid foods that seem to aggravate or cause discomfort.  There are many over-the-counter products available to control stomach acid and other symptoms. Discuss these with your caregiver before using them. Do not  stop taking prescription medications for over-the-counter medications without talking with your caregiver.  Special diets are not usually needed.  Keep any follow-up appointments and blood tests as directed. SEEK MEDICAL CARE IF:   Your pain or other ulcer symptoms do not improve within a few days of starting treatment.  You develop diarrhea. This can be a problem related to certain treatments.  You have ongoing indigestion or heartburn even if your main ulcer symptoms are improved.  You think you have any side effects from your medications or if you do not understand how to use your medications right. SEEK IMMEDIATE MEDICAL CARE IF:  Any of the following happen:  You develop bright red, rectal bleeding.  You develop dark black, tarry stools.  You throw up (vomit) blood.  You become light-headed, weak, have fainting episodes, or become sweaty, cold and clammy.  You have severe abdominal pain not controlled by medications. Do not take pain medications unless ordered by your caregiver. MAKE SURE  YOU:   Understand these instructions.  Will watch your condition.  Will get help right away if you are not doing well or get worse. Document Released: 11/17/2003 Document Revised: 11/19/2011 Document Reviewed: 04/15/2008 [facility] Patient Information 2013 [employer], [place].

## 2013-06-26 NOTE — Progress Notes (Signed)
[facility] Tana Conch, MD Phone: [phone]  Subjective:  Chief complaint-noted  # Epigastric Pain Location- epigastric Quality-aching or stabbing Severity- 7/10 Duration- 2.5 weeks Timing- all day and even difficult to sleep at night at times Context-started 2.5 weeks ago and does not remember triggering event, never had anything like this before, never had an ulcer. Went to ED and had unremarkable CMET, CBC, lipase and UA. Benign abdominal exam at that time Modifying factors- better with vicodin he got from ED and with ibuprofen though no improvement since 2.5 weeks ago overall. Pain is "deep" and only mild pain if he pushes on the area.  ROS/Associated symptoms-no fever, chills, nausea, vomiting. Bowel movements daily-perhaps straining slightly. Endorses increased burping/belching, feelings of fullness, and gas.   Past Medical History-smoker, hypertension, hyperlipidemia  Reviewed problem list.  Medications- reviewed and updated Current Outpatient Prescriptions on File Prior to Visit  Medication Sig Dispense Refill  . aspirin 81 MG tablet Take 81 mg by mouth daily.      Marland Kitchen HYDROcodone-acetaminophen (NORCO/VICODIN) 5-325 MG per tablet Take 1-2 tablets by mouth every 6 hours as needed for pain.  15 tablet  0  . pravastatin (PRAVACHOL) 20 MG tablet Take 1 tablet (20 mg total) by mouth daily.  30 tablet  11   No current facility-administered medications on file prior to visit.    Objective: BP 145/85  Pulse 48  Temp(Src) 98.1 F (36.7 C) (Oral)  Wt 171 lb (77.565 kg)  BMI 20.27 kg/m2 Gen: NAD, resting comfortably on table CV: RRR no murmurs rubs or gallops Lungs: CTAB no crackles, wheeze, rhonchi Abd: soft/nondistended. Mild tenderness to RUQ and LUQ deep palpation. No rebound or guarding.  Ext: no edema  Assessment/Plan: Of note, patient has not taken his blood pressure medicine today as he has not picked up from pharmacy. Gave 1 year of refills  and asked patient to follow up in 2-3 weeks.   # Epigastric Pain Initial plan was for H. Pylori testing (concern for ulcer), abdominal ultrasound, and PPI. Before patient left, POC H. Pylori testing was positive (patient with no history of infection or ulcer). Patient to be treated for gastric ulcer (constant pain slightly worse with meals) with H. Pylori as cause with triple therapy (amox, clarithro, PPI x 2 weeks). Follow up if not improved. Told patient that this could still be coming from other etiologies but I want him to avoid NSAIDs for now and we would ultimately get the abdominal ultrasound if he did not improve.

## 2013-08-21 ENCOUNTER — Ambulatory Visit: Payer: No Typology Code available for payment source | Admitting: Family Medicine

## 2013-09-15 ENCOUNTER — Ambulatory Visit: Payer: Self-pay

## 2013-09-17 ENCOUNTER — Ambulatory Visit: Payer: Self-pay | Admitting: Family Medicine

## 2013-09-22 ENCOUNTER — Ambulatory Visit: Payer: Self-pay | Admitting: Family Medicine

## 2013-09-28 ENCOUNTER — Ambulatory Visit: Payer: Self-pay | Admitting: Family Medicine

## 2013-10-06 ENCOUNTER — Ambulatory Visit: Payer: Self-pay | Admitting: Family Medicine

## 2013-10-09 ENCOUNTER — Ambulatory Visit: Payer: Self-pay | Admitting: Family Medicine

## 2013-10-15 ENCOUNTER — Ambulatory Visit: Payer: Self-pay

## 2014-06-14 ENCOUNTER — Telehealth: Payer: Self-pay | Admitting: Family Medicine

## 2014-06-14 NOTE — Telephone Encounter (Signed)
Will forward to MD to see if MD will call or suggest something else for him to take. Jazmin Hartsell,CMA

## 2014-06-14 NOTE — Telephone Encounter (Signed)
Pt states he is unable to pay for the omeprazole RX which is $43. Is requesting that an alternative be sent in. Pt has not been seen in almost a year (06/25/13 w/ Dr. Durene CalHunter). Advise pt that he would need to make an appt to meet his new PCP and get any meds. 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 Pt states he will call and may an appt once the job is over. Pls advise.

## 2014-06-16 ENCOUNTER — Encounter: Payer: Self-pay | Admitting: Physician Assistant

## 2014-06-16 NOTE — Telephone Encounter (Signed)
LM for patient to call back.  Please advise that he can purchase OTC prilosec, prevacid or zegerid.  Thanks [employer]andsJazmin Hartsell,CMA

## 2014-06-16 NOTE — Telephone Encounter (Signed)
He can use OTC PPIs, but he needs apt prior to prescriptions.   Thanks, [employer] IncJimmy

## 2014-06-30 ENCOUNTER — Ambulatory Visit: Payer: Self-pay

## 2014-07-07 ENCOUNTER — Encounter: Payer: Self-pay | Admitting: Family Medicine

## 2014-07-07 ENCOUNTER — Ambulatory Visit: Payer: Self-pay

## 2014-09-22 ENCOUNTER — Ambulatory Visit: Payer: Self-pay | Admitting: Family Medicine

## 2014-10-01 ENCOUNTER — Ambulatory Visit: Payer: Self-pay | Admitting: Family Medicine

## 2014-10-04 ENCOUNTER — Ambulatory Visit: Payer: Self-pay | Admitting: Family Medicine

## 2014-10-07 ENCOUNTER — Encounter: Payer: Self-pay | Admitting: Family Medicine

## 2014-10-07 ENCOUNTER — Ambulatory Visit (INDEPENDENT_AMBULATORY_CARE_PROVIDER_SITE_OTHER): Payer: Self-pay | Admitting: Family Medicine

## 2014-10-07 VITALS — BP 132/82 | HR 57 | Temp 98.3°F | Ht 76.0 in | Wt 178.0 lb

## 2014-10-07 DIAGNOSIS — A048 Other specified bacterial intestinal infections: Secondary | ICD-10-CM

## 2014-10-07 DIAGNOSIS — R1013 Epigastric pain: Secondary | ICD-10-CM

## 2014-10-07 DIAGNOSIS — E7800 Pure hypercholesterolemia, unspecified: Secondary | ICD-10-CM

## 2014-10-07 DIAGNOSIS — I10 Essential (primary) hypertension: Secondary | ICD-10-CM

## 2014-10-07 DIAGNOSIS — B9681 Helicobacter pylori [H. pylori] as the cause of diseases classified elsewhere: Secondary | ICD-10-CM

## 2014-10-07 DIAGNOSIS — Z8619 Personal history of other infectious and parasitic diseases: Secondary | ICD-10-CM

## 2014-10-07 DIAGNOSIS — Z23 Encounter for immunization: Secondary | ICD-10-CM

## 2014-10-07 DIAGNOSIS — K219 Gastro-esophageal reflux disease without esophagitis: Secondary | ICD-10-CM

## 2014-10-07 MED ORDER — PRAVASTATIN SODIUM 20 MG PO TABS: 20.0000 mg | ORAL_TABLET | Freq: Every day | ORAL | Status: DC

## 2014-10-07 MED ORDER — AMLODIPINE BESYLATE 5 MG PO TABS: 5.0000 mg | ORAL_TABLET | Freq: Every day | ORAL | Status: DC

## 2014-10-07 MED ORDER — OMEPRAZOLE 20 MG PO CPDR: 20.0000 mg | DELAYED_RELEASE_CAPSULE | Freq: Every day | ORAL | Status: DC

## 2014-10-07 NOTE — Patient Instructions (Signed)
Nice to meet you. We will check to make sure your h pylori was treated adequately.  We will call with the results.  Please start taking the amlodipine again. I would like for you to come back in 2 weeks for a nurse visit to have a blood pressure check.

## 2014-10-08 ENCOUNTER — Telehealth: Payer: Self-pay | Admitting: Family Medicine

## 2014-10-08 DIAGNOSIS — K219 Gastro-esophageal reflux disease without esophagitis: Secondary | ICD-10-CM

## 2014-10-08 DIAGNOSIS — A048 Other specified bacterial intestinal infections: Secondary | ICD-10-CM | POA: Insufficient documentation

## 2014-10-08 DIAGNOSIS — R1013 Epigastric pain: Secondary | ICD-10-CM

## 2014-10-08 LAB — H. PYLORI BREATH TEST: H. PYLORI BREATH TEST: DETECTED — AB

## 2014-10-08 MED ORDER — TETRACYCLINE HCL 500 MG PO CAPS: 500.0000 mg | ORAL_CAPSULE | Freq: Four times a day (QID) | ORAL | Status: AC

## 2014-10-08 MED ORDER — METRONIDAZOLE 250 MG PO TABS: 250.0000 mg | ORAL_TABLET | Freq: Four times a day (QID) | ORAL | Status: AC

## 2014-10-08 MED ORDER — BISMUTH SUBSALICYLATE 262 MG PO TABS: 524.0000 mg | ORAL_TABLET | Freq: Four times a day (QID) | ORAL | Status: AC

## 2014-10-08 MED ORDER — OMEPRAZOLE 20 MG PO CPDR: 20.0000 mg | DELAYED_RELEASE_CAPSULE | Freq: Two times a day (BID) | ORAL | Status: DC

## 2014-10-08 NOTE — Progress Notes (Signed)
Patient ID: Andre Jones, male   DOB: 04/13/1976, 39 y.o.   MRN: [id number]  Andre Jones Andre Sonnenberg, MD Phone: [phone]  [name] Andre is a 39 y.o. male who presents today for f/u.  HYPERTENSION Disease Monitoring Home BP Monitoring not checking Chest pain- no    Dyspnea- no Medications Compliance-  Has been out of meds for past 2 months.  Edema- no  HYPERLIPIDEMIA Symptoms Chest pain on exertion:  no   Leg claudication:   no Medications: Compliance- taking  Muscle aches- no  GERD: patient reports that he has been out of prilosec for the past 2 months. Notes that his epigastric discomfort returned after coming off of this medication, though has improved over the past month. He has been using mylanta. No pain at this time. He was previously treated for H pylori and states he completed the antibiotics and continued the PPI until 2 months ago. He does not take NSAIDs. He is unsure what causes the pain to worsen. He denies hematemesis and blood in stools.    Patient is a smoker.   ROS: Per HPI   Physical Exam Filed Vitals:   10/07/14 1558  BP: 132/82  Pulse:   Temp:     Gen: Well NAD HEENT: PERRL,  MMM Lungs: CTABL Nl WOB Heart: RRR  Abd: soft, NT, ND Exts: Non edematous BL  LE, warm and well perfused.    Assessment/Plan: Please see individual problem list.  Marikay Andre Sonnenberg, MD [facility] PGY[facility]

## 2014-10-08 NOTE — Telephone Encounter (Signed)
Tried to call patient and went straight to voicemail. Did not use any patient identifiers in the message. Advised of positive breath test and that we needed to treat for H pylori again. Advised that would send in antibiotics to be started. Gave clinic phone number for patient to call to confirm he received the message and for any questions. Prescriptions for flagyl, tetracycline, bismuth subsalicylate, and prilosec sent to pharmacy for patient.

## 2014-10-08 NOTE — Assessment & Plan Note (Signed)
Tolerating medication. Will continue pravastatin. Will return some morning for lipid panel.

## 2014-10-08 NOTE — Assessment & Plan Note (Signed)
Patient with known h pylori infection. Treated previously. Has been off PPI for 2 months and having epigastric discomfort. Will check H pylori breath test to confirm eradication of infection. If negative will likely need US abdomen and possible referral to GI for endoscopy. If positive will retreat for h pylori. Refill given of prilosec and advised to get from health department for orange card for $6. Given return precautions.

## 2014-10-08 NOTE — Assessment & Plan Note (Signed)
BP in normal range on recheck. Given elevated on initial check and prior history of elevated BP will restart amlodipine 5 mg. Will discontinue HCTZ at this time. He will come in for nursing BP check in 2 weeks.

## 2014-10-21 ENCOUNTER — Ambulatory Visit: Payer: Self-pay | Admitting: Family Medicine

## 2014-10-31 NOTE — Telephone Encounter (Signed)
Please call patient to ensure he received the below message. Thanks.

## 2014-11-01 ENCOUNTER — Ambulatory Visit: Payer: Self-pay | Admitting: Family Medicine

## 2014-11-01 NOTE — Telephone Encounter (Signed)
Tried to call patient and unable to leave voicemail.  Will try again later.  If can't reach after third try will mail letter. Jazmin Hartsell,CMA

## 2014-11-02 NOTE — Telephone Encounter (Signed)
Unable to reach patient.  Letter mailed for him to contact office. Jazmin Hartsell,CMA

## 2014-11-11 ENCOUNTER — Ambulatory Visit: Payer: Self-pay | Admitting: Family Medicine

## 2014-11-18 ENCOUNTER — Ambulatory Visit: Payer: Self-pay | Admitting: Family Medicine

## 2014-12-10 ENCOUNTER — Ambulatory Visit: Payer: Self-pay | Admitting: Family Medicine

## 2014-12-31 ENCOUNTER — Ambulatory Visit: Payer: Self-pay | Admitting: Family Medicine

## 2015-01-31 ENCOUNTER — Emergency Department (HOSPITAL_COMMUNITY): Payer: Self-pay

## 2015-01-31 ENCOUNTER — Encounter (HOSPITAL_COMMUNITY): Payer: Self-pay | Admitting: Emergency Medicine

## 2015-01-31 ENCOUNTER — Emergency Department (HOSPITAL_COMMUNITY)
Admission: EM | Admit: 2015-01-31 | Discharge: 2015-01-31 | Disposition: A | Discharge status: Home / Self Care | Payer: Self-pay | Attending: Emergency Medicine | Admitting: Emergency Medicine

## 2015-01-31 DIAGNOSIS — S01511A Laceration without foreign body of lip, initial encounter: Secondary | ICD-10-CM | POA: Insufficient documentation

## 2015-01-31 DIAGNOSIS — Z79899 Other long term (current) drug therapy: Secondary | ICD-10-CM | POA: Insufficient documentation

## 2015-01-31 DIAGNOSIS — Y998 Other external cause status: Secondary | ICD-10-CM | POA: Insufficient documentation

## 2015-01-31 DIAGNOSIS — Y9389 Activity, other specified: Secondary | ICD-10-CM | POA: Insufficient documentation

## 2015-01-31 DIAGNOSIS — I10 Essential (primary) hypertension: Secondary | ICD-10-CM | POA: Insufficient documentation

## 2015-01-31 DIAGNOSIS — E780 Pure hypercholesterolemia: Secondary | ICD-10-CM | POA: Insufficient documentation

## 2015-01-31 DIAGNOSIS — Z792 Long term (current) use of antibiotics: Secondary | ICD-10-CM | POA: Insufficient documentation

## 2015-01-31 DIAGNOSIS — R42 Dizziness and giddiness: Secondary | ICD-10-CM | POA: Insufficient documentation

## 2015-01-31 DIAGNOSIS — S032XXA Dislocation of tooth, initial encounter: Secondary | ICD-10-CM | POA: Insufficient documentation

## 2015-01-31 DIAGNOSIS — Y9289 Other specified places as the place of occurrence of the external cause: Secondary | ICD-10-CM | POA: Insufficient documentation

## 2015-01-31 DIAGNOSIS — K0889 Other specified disorders of teeth and supporting structures: Secondary | ICD-10-CM

## 2015-01-31 DIAGNOSIS — S0093XA Contusion of unspecified part of head, initial encounter: Secondary | ICD-10-CM

## 2015-01-31 DIAGNOSIS — Z7982 Long term (current) use of aspirin: Secondary | ICD-10-CM | POA: Insufficient documentation

## 2015-01-31 MED ORDER — LIDOCAINE-EPINEPHRINE-TETRACAINE (LET) SOLUTION
3.0000 mL | Freq: Once | NASAL | Status: AC
  Administered 2015-01-31: 3 mL via TOPICAL
  Filled 2015-01-31: qty 3

## 2015-01-31 MED ORDER — PENICILLIN V POTASSIUM 500 MG PO TABS: 500.0000 mg | ORAL_TABLET | Freq: Four times a day (QID) | ORAL | Status: AC

## 2015-01-31 MED ORDER — IBUPROFEN 800 MG PO TABS
800.0000 mg | ORAL_TABLET | Freq: Once | ORAL | Status: AC
  Administered 2015-01-31: 800 mg via ORAL
  Filled 2015-01-31: qty 1

## 2015-01-31 MED ORDER — LIDOCAINE-EPINEPHRINE (PF) 2 %-1:200000 IJ SOLN
10.0000 mL | Freq: Once | INTRAMUSCULAR | Status: AC
  Administered 2015-01-31: 10 mL
  Filled 2015-01-31: qty 20
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
## 2015-01-31 NOTE — Discharge Instructions (Signed)
Do not eat solid food that you have to bite into or chew until you have a dentist recheck your front tooth. It is loose and you have a fracture in the bone where the tooth root is located. Take the antibiotics to prevent an infection in the tooth and your laceration. Call Dr Lacretia Leigh office (the dentist on call tonight) to have him recheck your tooth. The sutures need to be removed in 3-5 days. Ice and cold liquids will help with the swelling and pain. You can take ibuprofen 600 mg and/or acetaminophen 1000 mg every 6 hrs as needed for pain. Return to the ED for an problems listed on the head injury sheet.  Head Injury You have a head injury. Headaches and throwing up (vomiting) are common after a head injury. It should be easy to wake up from sleeping. Sometimes you must stay in the hospital. Most problems happen within the first 24 hours. Side effects may occur up to 7-10 days after the injury.  WHAT ARE THE TYPES OF HEAD INJURIES? Head injuries can be as minor as a bump. Some head injuries can be more severe. More severe head injuries include:  A jarring injury to the brain (concussion).  A bruise of the brain (contusion). This mean there is bleeding in the brain that can cause swelling.  A cracked skull (skull fracture).  Bleeding in the brain that collects, clots, and forms a bump (hematoma). WHEN SHOULD I GET HELP RIGHT AWAY?   You are confused or sleepy.  You cannot be woken up.  You feel sick to your stomach (nauseous) or keep throwing up (vomiting).  Your dizziness or unsteadiness is getting worse.  You have very bad, lasting headaches that are not helped by medicine. Take medicines only as told by your doctor.  You cannot use your arms or legs like normal.  You cannot walk.  You notice changes in the black spots in the center of the colored part of your eye (pupil).  You have clear or bloody fluid coming from your nose or ears.  You have trouble seeing. During the next 24  hours after the injury, you must stay with someone who can watch you. This person should get help right away (call 911 in the [place] if you start to shake and are not able to control it (have seizures), you pass out, or you are unable to wake up. HOW CAN I PREVENT A HEAD INJURY IN THE FUTURE?  Wear seat belts.  Wear a helmet while bike riding and playing sports like football.  Stay away from dangerous activities around the house. WHEN CAN I RETURN TO NORMAL ACTIVITIES AND ATHLETICS? See your doctor before doing these activities. You should not do normal activities or play contact sports until 1 week after the following symptoms have stopped:  Headache that does not go away.  Dizziness.  Poor attention.  Confusion.  Memory problems.  Sickness to your stomach or throwing up.  Tiredness.  Fussiness.  Bothered by bright lights or loud noises.  Anxiousness or depression.  Restless sleep. MAKE SURE YOU:   Understand these instructions.  Will watch your condition.  Will get help right away if you are not doing well or get worse. Document Released: 08/09/2008 Document Revised: 01/11/2014 Document Reviewed: 05/04/2013 [facility] Patient Information 2015 [place], [place]. This information is not intended to replace advice given to you by your health care provider. Make sure you discuss any questions you have with your health care provider.  Facial Laceration A facial laceration is a cut on the face. These injuries can be painful and cause bleeding. Some cuts may need to be closed with stitches (sutures), skin adhesive strips, or wound glue. Cuts usually heal quickly but can leave a scar. It can take 1-2 years for the scar to go away completely. HOME CARE   Only take medicines as told by your doctor.  Follow your doctor's instructions for wound care. For Stitches:  Keep the cut clean and dry.  If you have a bandage (dressing), change it at least once a day. Change the  bandage if it gets wet or dirty, or as told by your doctor.  Wash the cut with soap and water 2 times a day. Rinse the cut with water. Pat it dry with a clean towel.  Put a thin layer of medicated cream on the cut as told by your doctor.  You may shower after the first 24 hours. Do not soak the cut in water until the stitches are removed.  Have your stitches removed as told by your doctor.  Do not wear any makeup until a few days after your stitches are removed. For Skin Adhesive Strips:  Keep the cut clean and dry.  Do not get the strips wet. You may take a bath, but be careful to keep the cut dry.  If the cut gets wet, pat it dry with a clean towel.  The strips will fall off on their own. Do not remove the strips that are still stuck to the cut. For Wound Glue:  You may shower or take baths. Do not soak or scrub the cut. Do not swim. Avoid heavy sweating until the glue falls off on its own. After a shower or bath, pat the cut dry with a clean towel.  Do not put medicine or makeup on your cut until the glue falls off.  If you have a bandage, do not put tape over the glue.  Avoid lots of sunlight or tanning lamps until the glue falls off.  The glue will fall off on its own in 5-10 days. Do not pick at the glue. After Healing: Put sunscreen on the cut for the first year to reduce your scar. GET HELP RIGHT AWAY IF:   Your cut area gets red, painful, or puffy (swollen).  You see a yellowish-white fluid (pus) coming from the cut.  You have chills or a fever. MAKE SURE YOU:   Understand these instructions.  Will watch your condition.  Will get help right away if you are not doing well or get worse. Document Released: 02/13/2008 Document Revised: 06/17/2013 Document Reviewed: 04/09/2013 [facility] Patient Information 2015 [place], [place]. This information is not intended to replace advice given to you by your health care provider. Make sure you discuss any questions you have  with your health care provider.  Contusion A contusion is a deep bruise. Contusions happen when an injury causes bleeding under the skin. Signs of bruising include pain, puffiness (swelling), and discolored skin. The contusion may turn blue, purple, or yellow. HOME CARE   Put ice on the injured area.  Put ice in a plastic bag.  Place a towel between your skin and the bag.  Leave the ice on for 15-20 minutes, 03-04 times a day.  Only take medicine as told by your doctor.  Rest the injured area.  If possible, raise (elevate) the injured area to lessen puffiness. GET HELP RIGHT AWAY IF:   You have  more bruising or puffiness.  You have pain that is getting worse.  Your puffiness or pain is not helped by medicine. MAKE SURE YOU:   Understand these instructions.  Will watch your condition.  Will get help right away if you are not doing well or get worse. Document Released: 02/13/2008 Document Revised: 11/19/2011 Document Reviewed: 07/02/2011 [facility] Patient Information 2015 [place], [place]. This information is not intended to replace advice given to you by your health care provider. Make sure you discuss any questions you have with your health care provider.

## 2015-01-31 NOTE — ED Provider Notes (Signed)
LACERATION REPAIR Performed by: Junius Finner'MALLEY, Karim Aiello A. Authorized by: Ina Homes'MALLEY, Derrick Orris A. Consent: Verbal consent obtained. Risks and benefits: risks, benefits and alternatives were discussed Consent given by: patient Patient identity confirmed: provided demographic data Prepped and Draped in normal sterile fashion Wound explored  Laceration Location: middle aspect lower lip  Laceration Length: 1.5cm  No Foreign Bodies seen or palpated  Anesthesia: topical and local infiltration  Local anesthetic: LET and lidocaine 2% with epinephrine  Anesthetic total: 1 ml  Irrigation method: syringe Amount of cleaning: standard  Skin closure: 5-0 prolene  Number of sutures: 4  Technique: interrupted   Patient tolerance: Patient tolerated the procedure well with no immediate complications.   Junius FinnerErin O'Malley, PA-C 01/31/15 [id number]  Devoria AlbeIva Knapp, MD 01/31/15 [phone]
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
## 2015-02-02 ENCOUNTER — Ambulatory Visit: Payer: Self-pay | Admitting: Family Medicine

## 2015-03-01 ENCOUNTER — Other Ambulatory Visit: Payer: Self-pay | Admitting: *Deleted

## 2015-03-01 DIAGNOSIS — K219 Gastro-esophageal reflux disease without esophagitis: Secondary | ICD-10-CM

## 2015-03-01 DIAGNOSIS — R1013 Epigastric pain: Secondary | ICD-10-CM

## 2015-03-01 MED ORDER — OMEPRAZOLE 20 MG PO CPDR: 20.0000 mg | DELAYED_RELEASE_CAPSULE | Freq: Two times a day (BID) | ORAL | Status: DC

## 2015-04-19 ENCOUNTER — Other Ambulatory Visit: Payer: Self-pay | Admitting: Family Medicine

## 2015-04-19 ENCOUNTER — Other Ambulatory Visit: Payer: Self-pay | Admitting: *Deleted

## 2015-04-19 DIAGNOSIS — R1013 Epigastric pain: Secondary | ICD-10-CM

## 2015-04-19 DIAGNOSIS — K219 Gastro-esophageal reflux disease without esophagitis: Secondary | ICD-10-CM

## 2015-04-19 MED ORDER — OMEPRAZOLE 20 MG PO CPDR: 20.0000 mg | DELAYED_RELEASE_CAPSULE | Freq: Two times a day (BID) | ORAL | Status: DC

## 2015-05-17 ENCOUNTER — Encounter: Payer: Self-pay | Admitting: *Deleted

## 2015-05-17 ENCOUNTER — Other Ambulatory Visit: Payer: Self-pay | Admitting: Family Medicine

## 2015-05-17 ENCOUNTER — Telehealth: Payer: Self-pay | Admitting: *Deleted

## 2015-05-17 DIAGNOSIS — R1013 Epigastric pain: Secondary | ICD-10-CM

## 2015-05-17 DIAGNOSIS — K219 Gastro-esophageal reflux disease without esophagitis: Secondary | ICD-10-CM

## 2015-05-17 DIAGNOSIS — I10 Essential (primary) hypertension: Secondary | ICD-10-CM

## 2015-05-17 MED ORDER — OMEPRAZOLE 20 MG PO CPDR: 20.0000 mg | DELAYED_RELEASE_CAPSULE | Freq: Two times a day (BID) | ORAL | Status: DC

## 2015-05-17 MED ORDER — AMLODIPINE BESYLATE 5 MG PO TABS: 5.0000 mg | ORAL_TABLET | Freq: Every day | ORAL | Status: DC

## 2015-05-17 MED ORDER — HYDROCHLOROTHIAZIDE 25 MG PO TABS: 25.0000 mg | ORAL_TABLET | Freq: Every day | ORAL | Status: DC

## 2015-05-17 NOTE — Telephone Encounter (Signed)
Needs refills on prilosec, HCTZ, and amlodipine Uses [facility]

## 2015-05-17 NOTE — Telephone Encounter (Signed)
Tried to reach patient by phone but the line would drop after 2 rings.  Mailed letter to patient. Kentley Cedillo,CMA

## 2015-05-17 NOTE — Telephone Encounter (Signed)
-----   Message from Jamal Collin, MD sent at 05/17/2015  9:58 AM EDT ----- Please call - Needs PCP visit prior to additional BP meds refilled. 1 month Rx faxed in

## 2015-11-09 ENCOUNTER — Ambulatory Visit: Payer: Self-pay

## 2015-11-17 ENCOUNTER — Ambulatory Visit: Payer: Self-pay | Admitting: Family Medicine

## 2016-06-01 ENCOUNTER — Emergency Department (HOSPITAL_COMMUNITY)
Admission: EM | Admit: 2016-06-01 | Discharge: 2016-06-01 | Disposition: A | Discharge status: Home / Self Care | Payer: Self-pay | Attending: Emergency Medicine | Admitting: Emergency Medicine

## 2016-06-01 ENCOUNTER — Encounter (HOSPITAL_COMMUNITY): Payer: Self-pay | Admitting: Emergency Medicine

## 2016-06-01 DIAGNOSIS — Z79899 Other long term (current) drug therapy: Secondary | ICD-10-CM | POA: Insufficient documentation

## 2016-06-01 DIAGNOSIS — Z7982 Long term (current) use of aspirin: Secondary | ICD-10-CM | POA: Insufficient documentation

## 2016-06-01 DIAGNOSIS — R1013 Epigastric pain: Secondary | ICD-10-CM | POA: Insufficient documentation

## 2016-06-01 DIAGNOSIS — K219 Gastro-esophageal reflux disease without esophagitis: Secondary | ICD-10-CM

## 2016-06-01 DIAGNOSIS — I10 Essential (primary) hypertension: Secondary | ICD-10-CM | POA: Insufficient documentation

## 2016-06-01 DIAGNOSIS — F17200 Nicotine dependence, unspecified, uncomplicated: Secondary | ICD-10-CM | POA: Insufficient documentation

## 2016-06-01 HISTORY — DX: Gastric ulcer, unspecified as acute or chronic, without hemorrhage or perforation: K25.9

## 2016-06-01 LAB — CBC WITH DIFFERENTIAL/PLATELET
Basophils Absolute: 0 10*3/uL (ref 0.0–0.1)
Basophils Relative: 1 %
Eosinophils Absolute: 0.3 10*3/uL (ref 0.0–0.7)
Eosinophils Relative: 4 %
HCT: 47.4 % (ref 39.0–52.0)
Hemoglobin: 16.3 g/dL (ref 13.0–17.0)
Lymphocytes Relative: 23 %
Lymphs Abs: 1.8 10*3/uL (ref 0.7–4.0)
MCH: 32.2 pg (ref 26.0–34.0)
MCHC: 34.4 g/dL (ref 30.0–36.0)
MCV: 93.7 fL (ref 78.0–100.0)
Monocytes Absolute: 0.8 10*3/uL (ref 0.1–1.0)
Monocytes Relative: 10 %
Neutro Abs: 4.8 10*3/uL (ref 1.7–7.7)
Neutrophils Relative %: 62 %
Platelets: 216 10*3/uL (ref 150–400)
RBC: 5.06 MIL/uL (ref 4.22–5.81)
RDW: 12.8 % (ref 11.5–15.5)
WBC: 7.7 10*3/uL (ref 4.0–10.5)

## 2016-06-01 LAB — COMPREHENSIVE METABOLIC PANEL
ALT: 14 U/L — ABNORMAL LOW (ref 17–63)
AST: 24 U/L (ref 15–41)
Albumin: 3.8 g/dL (ref 3.5–5.0)
Alkaline Phosphatase: 57 U/L (ref 38–126)
Anion gap: 8 (ref 5–15)
BUN: 9 mg/dL (ref 6–20)
CO2: 29 mmol/L (ref 22–32)
Calcium: 9.3 mg/dL (ref 8.9–10.3)
Chloride: 100 mmol/L — ABNORMAL LOW (ref 101–111)
Creatinine, Ser: 1.17 mg/dL (ref 0.61–1.24)
GFR calc Af Amer: >60 mL/min (ref >60)
GFR calc non Af Amer: >60 mL/min (ref >60)
Glucose, Bld: 85 mg/dL (ref 65–99)
Potassium: 4 mmol/L (ref 3.5–5.1)
Sodium: 137 mmol/L (ref 135–145)
Total Bilirubin: 0.5 mg/dL (ref 0.3–1.2)
Total Protein: 6.6 g/dL (ref 6.5–8.1)

## 2016-06-01 LAB — LIPASE, BLOOD: Lipase: 43 U/L (ref 11–51)

## 2016-06-01 MED ORDER — HYDROCHLOROTHIAZIDE 25 MG PO TABS
25.0000 mg | ORAL_TABLET | Freq: Every day | ORAL | 0 refills | Status: DC
Start: 2016-06-01 — End: 2017-01-11

## 2016-06-01 MED ORDER — SODIUM CHLORIDE 0.9 % IV BOLUS (SEPSIS)
1000.0000 mL | Freq: Once | INTRAVENOUS | Status: AC
  Administered 2016-06-01: 1000 mL via INTRAVENOUS

## 2016-06-01 MED ORDER — PRAVASTATIN SODIUM 20 MG PO TABS: 20.0000 mg | ORAL_TABLET | Freq: Every day | ORAL | 0 refills | Status: DC

## 2016-06-01 MED ORDER — OMEPRAZOLE 20 MG PO CPDR: 20.0000 mg | DELAYED_RELEASE_CAPSULE | Freq: Two times a day (BID) | ORAL | 0 refills | Status: DC

## 2016-06-01 MED ORDER — ONDANSETRON HCL 4 MG/2ML IJ SOLN
4.0000 mg | Freq: Once | INTRAMUSCULAR | Status: AC
  Administered 2016-06-01: 4 mg via INTRAVENOUS
  Filled 2016-06-01: qty 2

## 2016-06-01 MED ORDER — AMLODIPINE BESYLATE 5 MG PO TABS: 5.0000 mg | ORAL_TABLET | Freq: Every day | ORAL | 0 refills | Status: DC

## 2016-06-01 MED ORDER — FAMOTIDINE IN NACL 20-0.9 MG/50ML-% IV SOLN
20.0000 mg | Freq: Once | INTRAVENOUS | Status: AC
  Administered 2016-06-01: 20 mg via INTRAVENOUS
  Filled 2016-06-01: qty 50

## 2016-06-01 MED FILL — PRAVASTATIN NA 20 MG TAB: 20 | 30 days supply | Qty: 30 | Fill #0

## 2016-06-01 MED FILL — ?OMEPRAZOLE DR 20 MG CAPSUL: 20 | 30 days supply | Qty: 60 | Fill #0

## 2016-06-01 MED FILL — HYDROCHLOROTHIAZIDE 25 MG T: 25 | 30 days supply | Qty: 30 | Fill #0

## 2016-06-01 MED FILL — AMLODIPINE BESYLATE 5 MG TA: 5 | 30 days supply | Qty: 30 | Fill #0

## 2016-06-01 NOTE — ED Provider Notes (Signed)
[facility]-EMERGENCY DEPT Provider Note   CSN: [id number] Arrival date & time: 06/01/16  0709     History   Chief Complaint Chief Complaint  Patient presents with  . Abdominal Pain  . Nasal Congestion    HPI Andre Jones is a 40 y.o. male.  HPI   40 year old male with abdominal pain. Pain is in epigastrium. Waxing and waning over the past day. Worsening last night. Pain is constant. Does not radiate. He reports a past history of "ulcers." Feels that current symptoms may be secondary to this. He previously took Prilosec, but has been off of this as well as all his other medications for several months secondary to insurance/financial issues. Symptoms associated with nausea and vomiting several times last night. On bloody emesis. No fevers or chills. No shortness of breath, but he does have nasal/facial congestion. No diarrhea. No sick contacts. He is a smoker. Reports only occasional alcohol use. Denies regular NSAID usage.  Past Medical History:  Diagnosis Date  . Gastric ulcer   . HTN (hypertension)   . Hypercholesteremia     Patient Active Problem List   Diagnosis Date Noted  . H. pylori infection 10/08/2014  . Tobacco abuse 09/17/2012  . HTN (hypertension)   . Hypercholesteremia     Past Surgical History:  Procedure Laterality Date  . Surgical closed reduction of fractured right parasymphysis of mandible  10/28/99  . TYMPANOSTOMY TUBE PLACEMENT     as a child. Feels ear fullness sometimes. Has had normal audiological exam.        Home Medications    Prior to Admission medications   Medication Sig Start Date End Date Taking? Authorizing Provider  amLODipine (NORVASC) 5 MG tablet Take 1 tablet (5 mg total) by mouth daily. 05/17/15   Jamal CollinJames R Joyner, MD  aspirin 81 MG tablet Take 81 mg by mouth daily.    Historical Provider, MD  Bismuth Subsalicylate 262 MG TABS Take 2 tablets (524 mg total) by mouth 4 (four) times daily. Patient not taking: Reported on 01/31/2015 10/08/14    Glori LuisEric G Sonnenberg, MD  hydrochlorothiazide (HYDRODIURIL) 25 MG tablet Take 1 tablet (25 mg total) by mouth daily. 05/17/15   Jamal CollinJames R Joyner, MD  metroNIDAZOLE (FLAGYL) 250 MG tablet Take 1 tablet (250 mg total) by mouth 4 (four) times daily. Patient not taking: Reported on 01/31/2015 10/08/14   Glori LuisEric G Sonnenberg, MD  omeprazole (PRILOSEC) 20 MG capsule Take 1 capsule (20 mg total) by mouth 2 (two) times daily before a meal. 05/17/15   Jamal CollinJames R Joyner, MD  pravastatin (PRAVACHOL) 20 MG tablet Take 1 tablet (20 mg total) by mouth daily. 10/07/14   Glori LuisEric G Sonnenberg, MD  tetracycline (ACHROMYCIN,SUMYCIN) 500 MG capsule Take 1 capsule (500 mg total) by mouth 4 (four) times daily. Patient taking differently: Take 500 mg by mouth daily.  10/08/14   Glori LuisEric G Sonnenberg, MD    Family History Family History  Problem Relation Age of Onset  . Hypertension Brother     sister, mother, father  . Diabetes Mellitus II Mother     Social History Social History  Substance Use Topics  . Smoking status: Current Every Day Smoker    Packs/day: 0.50    Years: 10.00  . Smokeless tobacco: Never Used  . Alcohol use Yes     Comment: occasional-4 40 oz per week.      Allergies   Shellfish allergy   Review of Systems Review of Systems  All systems reviewed and  negative, other than as noted in HPI.   Physical Exam Updated Vital Signs BP (!) 148/111 (BP Location: Right Arm)   Pulse 76   Temp 98.5 F (36.9 C) (Oral)   Resp 18   SpO2 100%   Physical Exam  Constitutional: He appears well-developed and well-nourished. No distress.  HENT:  Head: Normocephalic and atraumatic.  Eyes: Conjunctivae are normal. Right eye exhibits no discharge. Left eye exhibits no discharge.  Neck: Neck supple.  Cardiovascular: Normal rate, regular rhythm and normal heart sounds.  Exam reveals no gallop and no friction rub.   No murmur heard. Pulmonary/Chest: Effort normal and breath sounds normal. No respiratory distress.    Abdominal: Soft. He exhibits no distension. There is tenderness.  Mild epigastric tenderness without rebound or guarding. No distention.  Musculoskeletal: He exhibits no edema or tenderness.  Neurological: He is alert.  Skin: Skin is warm and dry.  Psychiatric: He has a normal mood and affect. His behavior is normal. Thought content normal.  Nursing note and vitals reviewed.    ED Treatments / Results  Labs (all labs ordered are listed, but only abnormal results are displayed) Labs Reviewed  COMPREHENSIVE METABOLIC PANEL - Abnormal; Notable for the following:       Result Value   Chloride 100 (*)    ALT 14 (*)    All other components within normal limits  CBC WITH DIFFERENTIAL/PLATELET  LIPASE, BLOOD    EKG  EKG Interpretation None       Radiology No results found.  Procedures Procedures (including critical care time)  Medications Ordered in ED Medications  sodium chloride 0.9 % bolus 1,000 mL (0 mLs Intravenous Stopped 06/01/16 0901)  famotidine (PEPCID) IVPB 20 mg premix (0 mg Intravenous Stopped 06/01/16 0838)  ondansetron (ZOFRAN) injection 4 mg (4 mg Intravenous Given 06/01/16 1610)     Initial Impression / Assessment and Plan / ED Course  I have reviewed the triage vital signs and the nursing notes.  Pertinent labs & imaging results that were available during my care of the patient were reviewed by me and considered in my medical decision making (see chart for details).  Clinical Course    40 year old male with abdominal pain. His abdominal exam is fairly benign. Only minimal epigastric tenderness. Suspect symptoms related to dyspepsia. Will give him prescriptions for some medications including Prilosec. Doubt emergent process. Currently feels significantly better with symptomatically treatment. Labs unremarkable. Outpatient follow-up. Return precautions discussed.  Final Clinical Impressions(s) / ED Diagnoses   Final diagnoses:  Epigastric pain    New  Prescriptions New Prescriptions   No medications on file     Raeford Razor, MD 06/01/16 [phone]

## 2016-06-01 NOTE — ED Triage Notes (Signed)
Pt sts abd pain with some vomiting and hx of stomach ulcers not taking meds for; pt sts nasal congestion

## 2016-06-01 NOTE — ED Notes (Signed)
Pt verbalizes understanding of discharge instructions and prescriptions. NAD at discharge. Ambulatory to waiting room.

## 2016-06-08 ENCOUNTER — Inpatient Hospital Stay: Payer: Self-pay

## 2016-07-13 ENCOUNTER — Ambulatory Visit: Payer: Self-pay | Admitting: Family Medicine

## 2016-07-16 ENCOUNTER — Ambulatory Visit: Payer: Self-pay

## 2016-07-30 ENCOUNTER — Ambulatory Visit: Payer: Self-pay | Admitting: Family Medicine

## 2016-08-15 ENCOUNTER — Ambulatory Visit: Payer: Self-pay | Admitting: Family Medicine

## 2016-08-21 ENCOUNTER — Ambulatory Visit: Payer: Self-pay | Admitting: Family Medicine

## 2016-08-22 ENCOUNTER — Ambulatory Visit: Payer: Self-pay | Admitting: Family Medicine

## 2016-08-23 ENCOUNTER — Ambulatory Visit: Payer: Self-pay | Admitting: Family Medicine

## 2016-09-13 ENCOUNTER — Ambulatory Visit: Payer: Self-pay | Admitting: Family Medicine

## 2016-09-21 ENCOUNTER — Ambulatory Visit: Payer: Self-pay | Admitting: Family Medicine

## 2017-01-09 ENCOUNTER — Telehealth: Payer: Self-pay | Admitting: Family Medicine

## 2017-01-09 DIAGNOSIS — R1013 Epigastric pain: Secondary | ICD-10-CM

## 2017-01-09 DIAGNOSIS — K219 Gastro-esophageal reflux disease without esophagitis: Secondary | ICD-10-CM

## 2017-01-09 DIAGNOSIS — I10 Essential (primary) hypertension: Secondary | ICD-10-CM

## 2017-01-09 NOTE — Telephone Encounter (Signed)
Pt has not been seen in clinic since 2016.

## 2017-01-09 NOTE — Telephone Encounter (Signed)
Pt is calling for refills on his medications, but he has not been seen here in several years and he is a no show or cancel every time for at least 3 years or more. I suggest him making an appointment but he wants all his medications sent to the [facility] on [address]. Please call the patient if he needs to come in. As far as medications he said he needed his BP and ulcer medication. jw

## 2017-01-11 MED ORDER — HYDROCHLOROTHIAZIDE 25 MG PO TABS: 25.0000 mg | ORAL_TABLET | Freq: Every day | ORAL | 0 refills | Status: DC

## 2017-01-11 MED ORDER — AMLODIPINE BESYLATE 5 MG PO TABS
5.0000 mg | ORAL_TABLET | Freq: Every day | ORAL | 0 refills | Status: DC
Start: 2017-01-11 — End: 2017-01-18

## 2017-01-11 MED ORDER — OMEPRAZOLE 20 MG PO CPDR: 20.0000 mg | DELAYED_RELEASE_CAPSULE | Freq: Two times a day (BID) | ORAL | 0 refills | Status: DC

## 2017-01-11 NOTE — Telephone Encounter (Signed)
Spoke with patient and informed him that we can not continue to refill his medications without him being seen.  Patient states that he passed out at work the other day since he is out of medication.  Informed patient that we could schedule him an appointment to be seen here by PCP and only give him enough medication last until that appointment.  Patient voiced understanding.  I reiterated many times the importance of him coming in and that we wouldn't and shouldn't continue to refill without getting follow up labs on him.  Dayyan Krist,CMA

## 2017-01-18 ENCOUNTER — Ambulatory Visit (INDEPENDENT_AMBULATORY_CARE_PROVIDER_SITE_OTHER): Payer: Self-pay | Admitting: Family Medicine

## 2017-01-18 ENCOUNTER — Encounter: Payer: Self-pay | Admitting: Family Medicine

## 2017-01-18 VITALS — BP 102/60 | HR 67 | Temp 97.9°F | Wt 158.0 lb

## 2017-01-18 DIAGNOSIS — K219 Gastro-esophageal reflux disease without esophagitis: Secondary | ICD-10-CM

## 2017-01-18 DIAGNOSIS — R1013 Epigastric pain: Secondary | ICD-10-CM

## 2017-01-18 DIAGNOSIS — M545 Low back pain, unspecified: Secondary | ICD-10-CM

## 2017-01-18 DIAGNOSIS — I10 Essential (primary) hypertension: Secondary | ICD-10-CM

## 2017-01-18 MED ORDER — OMEPRAZOLE 20 MG PO CPDR: 20.0000 mg | DELAYED_RELEASE_CAPSULE | Freq: Two times a day (BID) | ORAL | 1 refills | Status: DC

## 2017-01-18 MED ORDER — PRAVASTATIN SODIUM 20 MG PO TABS: 20.0000 mg | ORAL_TABLET | Freq: Every day | ORAL | 1 refills | Status: AC

## 2017-01-18 MED ORDER — HYDROCHLOROTHIAZIDE 25 MG PO TABS: 25.0000 mg | ORAL_TABLET | Freq: Every day | ORAL | 1 refills | Status: DC

## 2017-01-18 MED ORDER — AMLODIPINE BESYLATE 5 MG PO TABS
5.0000 mg | ORAL_TABLET | Freq: Every day | ORAL | 1 refills | Status: DC
Start: 2017-01-18 — End: 2018-08-13

## 2017-01-18 MED ORDER — CYCLOBENZAPRINE HCL 10 MG PO TABS: 10.0000 mg | ORAL_TABLET | Freq: Three times a day (TID) | ORAL | 0 refills | Status: AC | PRN

## 2017-01-18 MED ORDER — IBUPROFEN 600 MG PO TABS: 600.0000 mg | ORAL_TABLET | Freq: Four times a day (QID) | ORAL | 0 refills | Status: AC | PRN
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
## 2017-01-18 NOTE — Patient Instructions (Addendum)
It was great seeing you today! We have addressed the following issues today  1. I refilled your prescriptions, amlodopine, hydrochlorothiazide, omeprazole and pravastatin. 2. [facility] 3. I will see you in about a month for follow up.  If we did any lab work today, and the results require attention, either me or my nurse will get in touch with you. If everything is normal, you will get a letter in mail and a message via . If you don't hear from us in two weeks, please give us a call. Otherwise, we look forward to seeing you again at your next visit. If you have any questions or concerns before then, please call the clinic at [phone].  Please bring all your medications to every doctors visit  Sign up for My Chart to have easy access to your labs results, and communication with your Primary care physician.    Please check-out at the front desk before leaving the clinic.    Take Care,   Dr. Sydnee Cabaliallo

## 2017-02-19 ENCOUNTER — Telehealth: Payer: Self-pay | Admitting: Family Medicine

## 2017-02-19 ENCOUNTER — Ambulatory Visit: Payer: Self-pay | Admitting: Family Medicine

## 2017-02-19 NOTE — Telephone Encounter (Signed)
Pt states he has been unable to pick up Rxs because they are too expensive ($73). Pt wants to know if PCP can order something cheaper. Pt has no insurance. ep

## 2017-02-19 NOTE — Telephone Encounter (Signed)
Please could you ask the patient which medications is too expensive for him. He can apply for the MAP program to get his medications through the Health Department. [facility] also [facility], he can come to clinic and talk to WittJackie about it. I am sure we discussed all these topics during his last visit, however this information should help him.  Lovena NeighboursAbdoulaye [place] Jones, PGY-1

## 2017-02-20 NOTE — Telephone Encounter (Signed)
Patient not home at this time.  Left message with the male who answered phone asking him to have patient to call back.  Valentine Kuechle,CMA

## 2017-04-27 ENCOUNTER — Encounter (HOSPITAL_COMMUNITY): Payer: Self-pay | Admitting: *Deleted

## 2017-04-27 ENCOUNTER — Emergency Department (HOSPITAL_COMMUNITY): Payer: Self-pay

## 2017-04-27 DIAGNOSIS — Z79899 Other long term (current) drug therapy: Secondary | ICD-10-CM | POA: Insufficient documentation

## 2017-04-27 DIAGNOSIS — Z791 Long term (current) use of non-steroidal anti-inflammatories (NSAID): Secondary | ICD-10-CM | POA: Insufficient documentation

## 2017-04-27 DIAGNOSIS — Y929 Unspecified place or not applicable: Secondary | ICD-10-CM | POA: Insufficient documentation

## 2017-04-27 DIAGNOSIS — H9201 Otalgia, right ear: Secondary | ICD-10-CM | POA: Insufficient documentation

## 2017-04-27 DIAGNOSIS — I10 Essential (primary) hypertension: Secondary | ICD-10-CM | POA: Insufficient documentation

## 2017-04-27 DIAGNOSIS — F17210 Nicotine dependence, cigarettes, uncomplicated: Secondary | ICD-10-CM | POA: Insufficient documentation

## 2017-04-27 DIAGNOSIS — W228XXA Striking against or struck by other objects, initial encounter: Secondary | ICD-10-CM | POA: Insufficient documentation

## 2017-04-27 DIAGNOSIS — Y9389 Activity, other specified: Secondary | ICD-10-CM | POA: Insufficient documentation

## 2017-04-27 DIAGNOSIS — S02651A Fracture of angle of right mandible, initial encounter for closed fracture: Secondary | ICD-10-CM | POA: Insufficient documentation

## 2017-04-27 DIAGNOSIS — Y999 Unspecified external cause status: Secondary | ICD-10-CM | POA: Insufficient documentation
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
## 2017-04-28 ENCOUNTER — Emergency Department (HOSPITAL_COMMUNITY): Payer: Self-pay

## 2017-04-28 ENCOUNTER — Emergency Department (HOSPITAL_COMMUNITY)
Admission: EM | Admit: 2017-04-28 | Discharge: 2017-04-28 | Disposition: A | Discharge status: Home / Self Care | Payer: Self-pay | Attending: Emergency Medicine | Admitting: Emergency Medicine

## 2017-04-28 DIAGNOSIS — S02651A Fracture of angle of right mandible, initial encounter for closed fracture: Secondary | ICD-10-CM

## 2017-04-28 MED ORDER — DIAZEPAM 5 MG PO TABS
5.0000 mg | ORAL_TABLET | Freq: Once | ORAL | Status: AC
  Administered 2017-04-28: 5 mg via ORAL
  Filled 2017-04-28: qty 1

## 2017-04-28 MED ORDER — OXYCODONE HCL 5 MG PO TABS
10.0000 mg | ORAL_TABLET | Freq: Once | ORAL | Status: AC
  Administered 2017-04-28: 10 mg via ORAL
  Filled 2017-04-28: qty 2

## 2017-04-28 MED ORDER — ACETAMINOPHEN 500 MG PO TABS
1000.0000 mg | ORAL_TABLET | Freq: Once | ORAL | Status: AC
  Administered 2017-04-28: 1000 mg via ORAL
  Filled 2017-04-28: qty 2

## 2017-04-28 MED ORDER — TETANUS-DIPHTH-ACELL PERTUSSIS 5-2.5-18.5 LF-MCG/0.5 IM SUSP
0.5000 mL | Freq: Once | INTRAMUSCULAR | Status: AC
  Administered 2017-04-28: 0.5 mL via INTRAMUSCULAR
  Filled 2017-04-28: qty 0.5

## 2017-04-28 MED ORDER — MORPHINE SULFATE 15 MG PO TABS: 15.0000 mg | ORAL_TABLET | ORAL | 0 refills | Status: AC | PRN

## 2017-04-28 MED ORDER — KETOROLAC TROMETHAMINE 60 MG/2ML IM SOLN
15.0000 mg | Freq: Once | INTRAMUSCULAR | Status: AC
  Administered 2017-04-28: 15 mg via INTRAMUSCULAR
  Filled 2017-04-28: qty 2

## 2017-04-28 MED ORDER — CLINDAMYCIN HCL 150 MG PO CAPS: 450.0000 mg | ORAL_CAPSULE | Freq: Three times a day (TID) | ORAL | 0 refills | Status: AC
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
## 2017-04-28 NOTE — Discharge Instructions (Signed)

## 2017-04-28 NOTE — ED Notes (Signed)
Patient transported to X-ray 
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
## 2017-04-28 NOTE — ED Notes (Signed)
Pt requested Tylenol, RN spoke to lead nurse, She agreed that pt could have meds.  Given

## 2017-04-30 ENCOUNTER — Ambulatory Visit: Payer: Self-pay | Admitting: Family Medicine
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
## 2018-08-13 ENCOUNTER — Other Ambulatory Visit: Payer: Self-pay | Admitting: Family Medicine

## 2018-08-13 DIAGNOSIS — R1013 Epigastric pain: Secondary | ICD-10-CM

## 2018-08-13 DIAGNOSIS — K219 Gastro-esophageal reflux disease without esophagitis: Secondary | ICD-10-CM

## 2018-08-13 DIAGNOSIS — I10 Essential (primary) hypertension: Secondary | ICD-10-CM

## 2018-08-13 MED ORDER — ASPIRIN 81 MG PO TABS: 81.0000 mg | ORAL_TABLET | Freq: Every day | ORAL | 0 refills | Status: AC

## 2018-08-13 MED ORDER — OMEPRAZOLE 20 MG PO CPDR: 20.0000 mg | DELAYED_RELEASE_CAPSULE | Freq: Two times a day (BID) | ORAL | 1 refills | Status: AC

## 2018-08-13 MED ORDER — AMLODIPINE BESYLATE 5 MG PO TABS: 5.0000 mg | ORAL_TABLET | Freq: Every day | ORAL | 1 refills | Status: AC

## 2018-08-13 MED ORDER — HYDROCHLOROTHIAZIDE 25 MG PO TABS: 25.0000 mg | ORAL_TABLET | Freq: Every day | ORAL | 1 refills | Status: AC

## 2018-08-13 NOTE — Telephone Encounter (Signed)
Patient needs refills on Hydrochlorothiazide, Amlodipine, Aspirin 81 mg, Omeprazole.  [facility] [place]

## 2018-09-30 ENCOUNTER — Emergency Department (HOSPITAL_COMMUNITY)
Admission: EM | Admit: 2018-09-30 | Discharge: 2018-09-30 | Disposition: A | Discharge status: Home / Self Care | Payer: Self-pay | Attending: Emergency Medicine | Admitting: Emergency Medicine

## 2018-09-30 ENCOUNTER — Other Ambulatory Visit: Payer: Self-pay

## 2018-09-30 DIAGNOSIS — Z7982 Long term (current) use of aspirin: Secondary | ICD-10-CM | POA: Insufficient documentation

## 2018-09-30 DIAGNOSIS — K047 Periapical abscess without sinus: Secondary | ICD-10-CM | POA: Insufficient documentation

## 2018-09-30 DIAGNOSIS — I10 Essential (primary) hypertension: Secondary | ICD-10-CM | POA: Insufficient documentation

## 2018-09-30 DIAGNOSIS — Z79899 Other long term (current) drug therapy: Secondary | ICD-10-CM | POA: Insufficient documentation

## 2018-09-30 DIAGNOSIS — F17200 Nicotine dependence, unspecified, uncomplicated: Secondary | ICD-10-CM | POA: Insufficient documentation

## 2018-09-30 DIAGNOSIS — K0381 Cracked tooth: Secondary | ICD-10-CM | POA: Insufficient documentation

## 2018-09-30 MED ORDER — ACETAMINOPHEN 500 MG PO TABS
1000.0000 mg | ORAL_TABLET | Freq: Once | ORAL | Status: AC
  Administered 2018-09-30: 1000 mg via ORAL
  Filled 2018-09-30: qty 2

## 2018-09-30 MED ORDER — IBUPROFEN 800 MG PO TABS
800.0000 mg | ORAL_TABLET | Freq: Once | ORAL | Status: AC
  Administered 2018-09-30: 800 mg via ORAL
  Filled 2018-09-30: qty 1

## 2018-09-30 MED ORDER — CLINDAMYCIN HCL 150 MG PO CAPS: 450.0000 mg | ORAL_CAPSULE | Freq: Three times a day (TID) | ORAL | 0 refills | Status: AC

## 2018-09-30 MED ORDER — DIAZEPAM 5 MG PO TABS
5.0000 mg | ORAL_TABLET | Freq: Once | ORAL | Status: AC
  Administered 2018-09-30: 5 mg via ORAL
  Filled 2018-09-30: qty 1

## 2018-09-30 MED ORDER — OXYCODONE HCL 5 MG PO TABS
5.0000 mg | ORAL_TABLET | Freq: Once | ORAL | Status: AC
  Administered 2018-09-30: 5 mg via ORAL
  Filled 2018-09-30: qty 1

## 2018-09-30 MED ORDER — BUPIVACAINE-EPINEPHRINE (PF) 0.5% -1:200000 IJ SOLN
1.8000 mL | Freq: Once | INTRAMUSCULAR | Status: AC
  Administered 2018-09-30: 1.8 mL
  Filled 2018-09-30: qty 1.8

## 2018-09-30 NOTE — ED Triage Notes (Addendum)
Pt reports that he has been having pain on the left side of his face and swelling to his left cheek area. Pt reports he has a "bad tooth" on that side and thinks he may have a abscess. Pt also reports he has been out of his blood pressure medications for about a month.
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
## 2018-09-30 NOTE — Discharge Instructions (Addendum)
Take 4 over the counter ibuprofen tablets 3 times a day or 2 over-the-counter naproxen tablets twice a day for pain. Also take tylenol 1000mg (2 extra strength) four times a day.   Go to a Dentist as they are the only ones who can fix this.   As we discussed return for pain when you move your eye change in your vision fever or sudden worsening pain.
